# Patient Record
Sex: Male | Born: 1965 | Race: White | Hispanic: No | Marital: Single | Smoking: Current every day smoker
Health system: Southern US, Community
[De-identification: ages and names within clinical notes are randomized; demographics above are authoritative.]

## PROBLEM LIST (undated history)

## (undated) DIAGNOSIS — F419 Anxiety disorder, unspecified: Secondary | ICD-10-CM

## (undated) DIAGNOSIS — I10 Essential (primary) hypertension: Secondary | ICD-10-CM

## (undated) HISTORY — PX: APPENDECTOMY: SHX54

---

## 2016-06-13 ENCOUNTER — Emergency Department (HOSPITAL_BASED_OUTPATIENT_CLINIC_OR_DEPARTMENT_OTHER)
Admission: EM | Admit: 2016-06-13 | Discharge: 2016-06-13 | Disposition: A | Discharge status: Home / Self Care | Payer: Self-pay | Attending: Emergency Medicine | Admitting: Emergency Medicine

## 2016-06-13 ENCOUNTER — Emergency Department (HOSPITAL_BASED_OUTPATIENT_CLINIC_OR_DEPARTMENT_OTHER): Payer: Self-pay

## 2016-06-13 ENCOUNTER — Encounter (HOSPITAL_BASED_OUTPATIENT_CLINIC_OR_DEPARTMENT_OTHER): Payer: Self-pay | Admitting: Emergency Medicine

## 2016-06-13 DIAGNOSIS — I10 Essential (primary) hypertension: Secondary | ICD-10-CM | POA: Insufficient documentation

## 2016-06-13 DIAGNOSIS — M25552 Pain in left hip: Secondary | ICD-10-CM | POA: Insufficient documentation

## 2016-06-13 DIAGNOSIS — F17200 Nicotine dependence, unspecified, uncomplicated: Secondary | ICD-10-CM | POA: Insufficient documentation

## 2016-06-13 DIAGNOSIS — R030 Elevated blood-pressure reading, without diagnosis of hypertension: Secondary | ICD-10-CM

## 2016-06-13 HISTORY — DX: Essential (primary) hypertension: I10

## 2016-06-13 HISTORY — DX: Anxiety disorder, unspecified: F41.9

## 2016-06-13 MED ORDER — KETOROLAC TROMETHAMINE 30 MG/ML IJ SOLN
30.0000 mg | Freq: Once | INTRAMUSCULAR | Status: AC
  Administered 2016-06-13: 30 mg via INTRAMUSCULAR
  Filled 2016-06-13: qty 1

## 2016-06-13 MED ORDER — METHYLPREDNISOLONE 4 MG PO TBPK: ORAL_TABLET | ORAL | 0 refills | Status: AC

## 2016-06-13 MED ORDER — METHOCARBAMOL 500 MG PO TABS
500.0000 mg | ORAL_TABLET | Freq: Once | ORAL | Status: AC
  Administered 2016-06-13: 500 mg via ORAL
  Filled 2016-06-13: qty 1

## 2016-06-13 MED ORDER — METHOCARBAMOL 500 MG PO TABS: 500.0000 mg | ORAL_TABLET | Freq: Two times a day (BID) | ORAL | 0 refills | Status: AC | PRN

## 2016-06-13 NOTE — ED Triage Notes (Signed)
Pt reports L hip pain x 1 week. Denies injury. Works as a [employer]at[employer]ic.

## 2016-06-13 NOTE — Discharge Instructions (Signed)
Take steroid dosepack as directed. Robaxin as needed for muscle spasms/tightness. You may also take ibuprofen or tylenol as needed for pain. Ice affected area for additional pain relief. If no improvement in symptoms, please call the sports medicine physician listed for re-evaluation of your symptoms.   Your blood pressure was elevated today. Continue taking your bloody pressure medication daily as directed. Please follow up with your primary care provider for a BP recheck in the next week.   Return to ER for new or worsening symptoms, any additional concerns.

## 2016-06-13 NOTE — ED Provider Notes (Signed)
[facility]   CSN: [id number] Arrival date & time: 06/13/16  1332     History   Chief Complaint Chief Complaint  Patient presents with  . Hip Pain    HPI Melvin Mendez is a 51 y.o. male.  The history is provided by the patient and medical records. No language interpreter was used.  Hip Pain    Melvin Mendez is a 51 y.o. male  with a PMH of HTN, anxiety who presents to the Emergency Department complaining of persistent aching, non-radiating left hip pain x 7-10 days. Pain is worse with movement and better when still. He is taken ibuprofen, Goody's powder and Tylenol with little relief in symptoms. Pain is worst thing in the morning and seems to improve as the day goes on. No history of similar. 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 No numbness or tingling. No low back pain. No fevers, bowel or bladder incontinence or saddle anesthesia. No rash or overlying skin changes.   Past Medical History:  Diagnosis Date  . Anxiety   . Hypertension     There are no active problems to display for this patient.   Past Surgical History:  Procedure Laterality Date  . APPENDECTOMY         Home Medications    Prior to Admission medications   Medication Sig Start Date End Date Taking? Authorizing Provider  methocarbamol (ROBAXIN) 500 MG tablet Take 1 tablet (500 mg total) by mouth 2 (two) times daily as needed for muscle spasms. 06/13/16   Jaime Pilcher Ward, PA-C  methylPREDNISolone (MEDROL DOSEPAK) 4 MG TBPK tablet Take as directed. 06/13/16   Chase PicketJaime Pilcher Ward, PA-C    Family History No family history on file.  Social History Social History  Substance Use Topics  . Smoking status: Current Every Day Smoker  . Smokeless tobacco: Never Used  . Alcohol use No     Allergies   Patient has no known allergies.   Review of Systems Review of Systems  Musculoskeletal: Positive for arthralgias. Negative for back pain.  Skin:  Negative for color change and rash.  Neurological: Negative for weakness and numbness.     Physical Exam Updated Vital Signs BP (!) 142/101 (BP Location: Right Arm)   Pulse 67   Temp 98.2 F (36.8 C) (Oral)   Resp 18   Ht 5' 5.5" (1.664 m)   Wt 63.5 kg   SpO2 100%   BMI 22.94 kg/m   Physical Exam  Constitutional: He is oriented to person, place, and time. He appears well-developed and well-nourished. No distress.  HENT:  Head: Normocephalic and atraumatic.  Cardiovascular: Normal rate, regular rhythm and normal heart sounds.   No murmur heard. Pulmonary/Chest: Effort normal and breath sounds normal. No respiratory distress.  Abdominal: Soft. He exhibits no distension. There is no tenderness.  Musculoskeletal:  Tenderness to palpation of the left lateral hip and buttocks. No overlying skin changes. Full range of motion, although with pain. Straight leg raises negative bilaterally for radicular symptoms. No midline C/T/L-spine tenderness. 2+ DP and sensation intact bilaterally.   Neurological: He is alert and oriented to person, place, and time.  Skin: Skin is warm and dry.  Nursing note and vitals reviewed.    ED Treatments / Results  Labs (all labs ordered are listed, but only abnormal results are displayed) Labs Reviewed - No data to display  EKG  EKG Interpretation None       Radiology Dg  Hip Unilat W Or Wo Pelvis 2-3 Views Left  Result Date: 06/13/2016 CLINICAL DATA:  Left hip pain for the past 7-10 days. No known injury. EXAM: DG HIP (WITH OR WITHOUT PELVIS) 2-3V LEFT COMPARISON:  None. FINDINGS: No fracture or dislocation. Mild degenerative change the left hip with joint space loss, subchondral sclerosis and osteophytosis. Limited visualization the pelvis is normal. Suspected mild degenerative change of the contralateral right hip, incompletely evaluated. Regional soft tissues appear normal. IMPRESSION: 1. No acute findings. 2. 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 Electronically Signed   By: Simonne Come M.D.   On: 06/13/2016 14:07    Procedures Procedures (including critical care time)  Medications Ordered in ED Medications  ketorolac (TORADOL) 30 MG/ML injection 30 mg (30 mg Intramuscular Given 06/13/16 1530)  methocarbamol (ROBAXIN) tablet 500 mg (500 mg Oral Given 06/13/16 1530)     Initial Impression / Assessment and Plan / ED Course  I have reviewed the triage vital signs and the nursing notes.  Pertinent labs & imaging results that were available during my care of the patient were reviewed by me and considered in my medical decision making (see chart for details).    Rush Salce is a 51 y.o. male who presents to ED for left hip pain. No back pain nor red flag symptoms of back pain. No overlying skin changes. Bilateral LE's NVI. X-ray negative for acute findings. Will treat with muscle relaxer and medrol dose pack. Sports medicine follow up if no improvement. Symptomatic home care instructions discussed with patient.   BP elevated in ED today. Patient with history of hypertension and states he's been compliant with medications. Encouraged continuing to take medication as directed and follow up with PCP this week for BP recheck.  Reasons to return to ER were discussed. The patient understands plan as dictated above and all questions were answered.   Final Clinical Impressions(s) / ED Diagnoses   Final diagnoses:  Left hip pain  Elevated blood pressure reading    New Prescriptions New Prescriptions   METHOCARBAMOL (ROBAXIN) 500 MG TABLET    Take 1 tablet (500 mg total) by mouth 2 (two) times daily as needed for muscle spasms.   METHYLPREDNISOLONE (MEDROL DOSEPAK) 4 MG TBPK TABLET    Take as directed.     [facility] Ward, PA-C 06/13/16 1611    Vanetta Mulders, MD 06/15/16 [phone]

## 2016-06-16 ENCOUNTER — Encounter (HOSPITAL_BASED_OUTPATIENT_CLINIC_OR_DEPARTMENT_OTHER): Payer: Self-pay

## 2016-06-16 ENCOUNTER — Emergency Department (HOSPITAL_BASED_OUTPATIENT_CLINIC_OR_DEPARTMENT_OTHER)
Admission: EM | Admit: 2016-06-16 | Discharge: 2016-06-16 | Disposition: A | Discharge status: Home / Self Care | Payer: Self-pay | Attending: Emergency Medicine | Admitting: Emergency Medicine

## 2016-06-16 DIAGNOSIS — M25552 Pain in left hip: Secondary | ICD-10-CM | POA: Insufficient documentation

## 2016-06-16 DIAGNOSIS — Z79899 Other long term (current) drug therapy: Secondary | ICD-10-CM | POA: Insufficient documentation

## 2016-06-16 DIAGNOSIS — F17210 Nicotine dependence, cigarettes, uncomplicated: Secondary | ICD-10-CM | POA: Insufficient documentation

## 2016-06-16 DIAGNOSIS — I10 Essential (primary) hypertension: Secondary | ICD-10-CM | POA: Insufficient documentation

## 2016-06-16 MED ORDER — KETOROLAC TROMETHAMINE 60 MG/2ML IM SOLN
60.0000 mg | Freq: Once | INTRAMUSCULAR | Status: AC
  Administered 2016-06-16: 60 mg via INTRAMUSCULAR
  Filled 2016-06-16: qty 2

## 2016-06-16 MED ORDER — TRAMADOL HCL 50 MG PO TABS
50.0000 mg | ORAL_TABLET | Freq: Once | ORAL | Status: AC
Start: 2016-06-16 — End: 2016-06-16
  Administered 2016-06-16: 50 mg via ORAL
  Filled 2016-06-16: qty 1

## 2016-06-16 MED ORDER — TRAMADOL HCL 50 MG PO TABS: 50.0000 mg | ORAL_TABLET | Freq: Four times a day (QID) | ORAL | 0 refills | Status: AC | PRN

## 2016-06-16 NOTE — ED Provider Notes (Signed)
[facility]   CSN: [id number] Arrival date & time: 06/16/16  1558  By signing my name below, I, Melvin Mendez, attest that this documentation has been prepared under the direction and in the presence of [employer], PA-C. Electronically Signed: Linna Mendez, Scribe. 06/16/2016. 4:48 PM.  History   Chief Complaint Chief Complaint  Patient presents with  . Hip Pain    The history is provided by the patient. No language interpreter was used.    HPI Comments: Melvin Mendez is a 51 y.o. male who presents to the Emergency Department complaining of constant, gradually worsening, sharp. non-radiating left hip pain for just over one week. He states his pain is exacerbated by ambulation and manipulation of his left hip and is improved at rest. Pt was seen here on 3/17 for the same and had a left hip x-ray taken which showed arthritis and degenerative changes of his left hip; he was discharged with Robaxin and a medrol dose pack and has taken them with little improvement of his pain. Pt has also tried multiple OTC medications with minimal improvement of his pain. 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 He denies numbness/tingling, lower back pain, fevers, or any other associated symptoms. He intends to follow up with sports medicine as soon as possible but does not currently have a scheduled appointment.   Past Medical History:  Diagnosis Date  . Anxiety   . Hypertension     There are no active problems to display for this patient.   Past Surgical History:  Procedure Laterality Date  . APPENDECTOMY         Home Medications    Prior to Admission medications   Medication Sig Start Date End Date Taking? Authorizing Provider  methocarbamol (ROBAXIN) 500 MG tablet Take 1 tablet (500 mg total) by mouth 2 (two) times daily as needed for muscle spasms. 06/13/16   Jaime Pilcher  Ward, PA-C  methylPREDNISolone (MEDROL DOSEPAK) 4 MG TBPK tablet Take as directed. 06/13/16   Chase Picket Ward, PA-C    Family History No family history on file.  Social History Social History  Substance Use Topics  . Smoking status: Current Every Day Smoker    Types: Cigarettes  . Smokeless tobacco: Never Used  . Alcohol use No     Allergies   Patient has no known allergies.   Review of Systems Review of Systems  Constitutional: Negative for activity change, fever and unexpected weight change.  Gastrointestinal: Negative for constipation.       Neg for fecal incontinence  Genitourinary: Negative for difficulty urinating, flank pain and hematuria.       Negative for urinary incontinence or retention  Musculoskeletal: Positive for arthralgias, gait problem (2/2 pain) and myalgias. Negative for back pain, joint swelling and neck pain.  Skin: Negative for wound.  Neurological: Negative for weakness and numbness.       Negative for saddle paresthesias     Physical Exam Updated Vital Signs BP (!) 171/111 (BP Location: Left Arm)   Pulse (!) 103   Temp 98.4 F (36.9 C) (Oral)   Resp 20   Ht 5\' 6"  (1.676 m)   Wt 140 lb (63.5 kg)   SpO2 100%   BMI 22.60 kg/m   Physical Exam  Constitutional: He is oriented to person, place, and time. He appears well-developed and well-nourished. No distress.  HENT:  Head:  Normocephalic and atraumatic.  Eyes: Conjunctivae and EOM are normal.  Neck: Normal range of motion. Neck supple. No tracheal deviation present.  Cardiovascular: Normal rate and normal pulses.  Exam reveals no decreased pulses.   Pulmonary/Chest: Effort normal. No respiratory distress.  Musculoskeletal: Normal range of motion. He exhibits tenderness. He exhibits no edema.       Left hip: He exhibits tenderness. He exhibits normal range of motion and normal strength.       Left knee: Normal.       Lumbar back: Normal.       Left upper leg: He exhibits tenderness. He  exhibits no bony tenderness, no swelling and no edema.       Legs: Neurological: He is alert and oriented to person, place, and time. No sensory deficit.  Motor, sensation, and vascular distal to the injury is fully intact.   Skin: Skin is warm and dry.  Psychiatric: He has a normal mood and affect. His behavior is normal.  Nursing note and vitals reviewed.   ED Treatments / Results   Procedures Procedures (including critical care time)  DIAGNOSTIC STUDIES: Oxygen Saturation is 100% on RA, normal by my interpretation.    COORDINATION OF CARE: 4:57 PM Patient seen and examined. Reviewed imaging from previous visit. Discussed treatment plan with pt at bedside and pt agreed to plan.  Medications Ordered in ED Medications - No data to display   Initial Impression / Assessment and Plan / ED Course  I have reviewed the triage vital signs and the nursing notes.  Pertinent labs & imaging results that were available during my care of the patient were reviewed by me and considered in my medical decision making (see chart for details).     Will discharged on tramadol. Patient given oral tramadol and IM Toradol here. Strongly encouraged follow-up with sports medicine. Patient states he will call for an appointment tomorrow morning.  Counseled on rice protocol. Crutches given to help with ambulation. Patient can use these as needed.  Patient counseled on use of Tramadol. Counseled not to combine these medications with others containing tylenol. Urged not to drink alcohol, drive, or perform any other activities that requires focus while taking these medications. The patient verbalizes understanding and agrees with the plan.   Final Clinical Impressions(s) / ED Diagnoses   Final diagnoses:  Left hip pain   Patient with left hip pain and left posterior thigh pain without swelling or injury. Pain gradually worsened over the past 1 week. Pain is better at rest and worse with activity and  bearing weight. Pain is localized but has neurologic features. No back pain or red flags. No signs of soft tissue swelling, cellulitis, signs of DVT. Strongly encouraged sports medicine follow-up for more definitive diagnosis and management.  New Prescriptions New Prescriptions   TRAMADOL (ULTRAM) 50 MG TABLET    Take 1 tablet (50 mg total) by mouth every 6 (six) hours as needed.   I personally performed the services described in this documentation, which was scribed in my presence. The recorded information has been reviewed and is accurate.    Renne CriglerJoshua Josslyn Ciolek, PA-C 06/16/16 1747    Lavera Guiseana Duo Liu, MD 06/17/16 [phone]

## 2016-06-16 NOTE — ED Triage Notes (Signed)
c/o left hip pain x 7-10 days-denies injury-seen here for same 3/17-did not follow up with ortho

## 2016-06-16 NOTE — Discharge Instructions (Signed)
Please read and follow all provided instructions.  Your diagnoses today include:  1. Left hip pain     Tests performed today include:  Vital signs. See below for your results today.   Medications prescribed:   Tramadol - narcotic-like pain medication  DO NOT drive or perform any activities that require you to be awake and alert because this medicine can make you drowsy.   Take any prescribed medications only as directed.  Home care instructions:   Follow any educational materials contained in this packet  Follow R.I.C.E. Protocol:  R - rest your injury   I  - use ice on injury without applying directly to skin  C - compress injury with bandage or splint  E - elevate the injury as much as possible  Follow-up instructions: Please follow-up with the provided orthopedic physician (bone specialist) as soon as possible.   Return instructions:   Please return if your toes or feet are numb or tingling, appear gray or blue, or you have severe pain (also elevate the leg and loosen splint or wrap if you were given one)  Please return to the Emergency Department if you experience worsening symptoms.   Please return if you have any other emergent concerns.  Additional Information:  Your vital signs today were: BP (!) 171/111 (BP Location: Left Arm)    Pulse (!) 103    Temp 98.4 F (36.9 C) (Oral)    Resp 20    Ht 5\' 6"  (1.676 m)    Wt 63.5 kg    SpO2 100%    BMI 22.60 kg/m  If your blood pressure (BP) was elevated above 135/85 this visit, please have this repeated by your doctor within one month. -------------- If prescribed crutches for your injury: use crutches with non-weight bearing for the first few days. Then, you may walk as the pain allows, or as instructed. Start gradually with weight bearing on the affected side. Once you can walk pain free, then try jogging. When you can run forwards, then you can try moving side-to-side. If you cannot walk without crutches in one  week, you need a re-check. --------------

## 2016-06-16 NOTE — ED Notes (Signed)
ED Provider at bedside. 

## 2018-04-12 IMAGING — DX DG HIP (WITH OR WITHOUT PELVIS) 2-3V*L*
3 series · 3 of 3 positions shown · non-contrast
Comparison: None.

CLINICAL DATA: Left hip pain for the past 7-10 days. No known
injury.

EXAM:
DG HIP (WITH OR WITHOUT PELVIS) 2-3V LEFT

[pelvis ap]
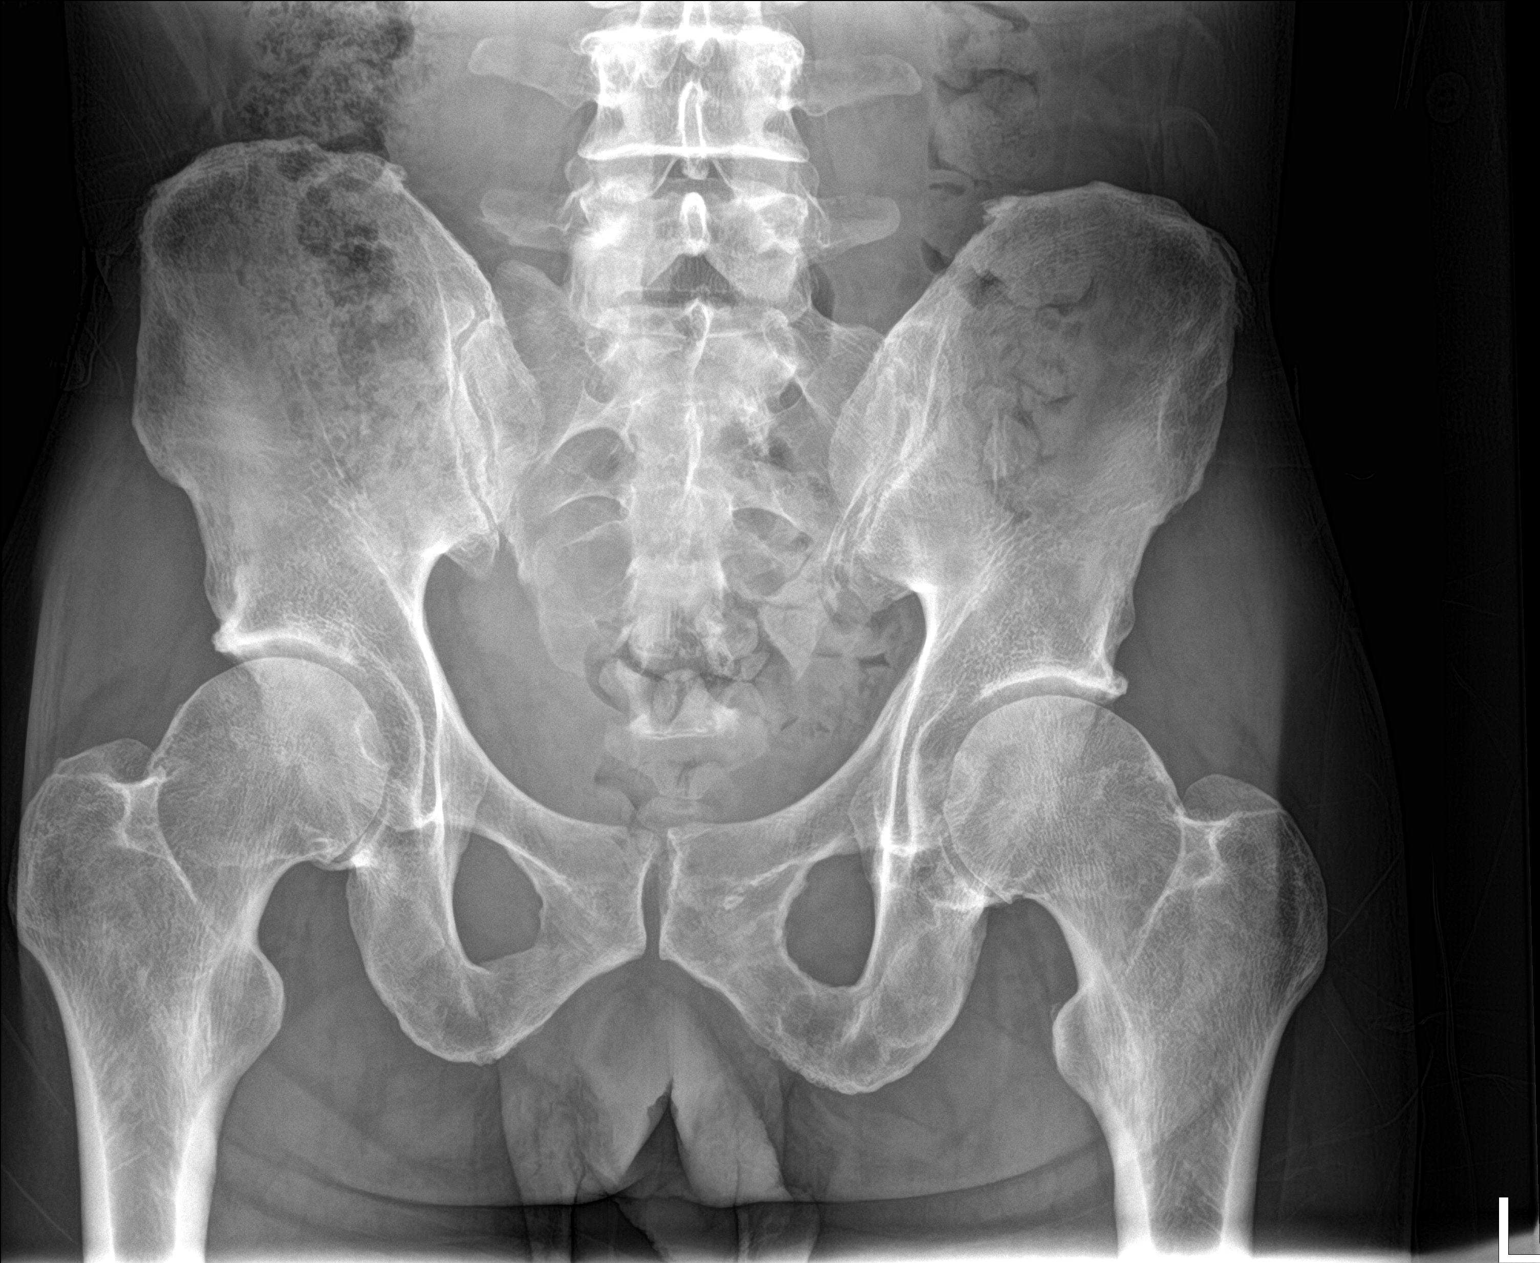

[hip ap]
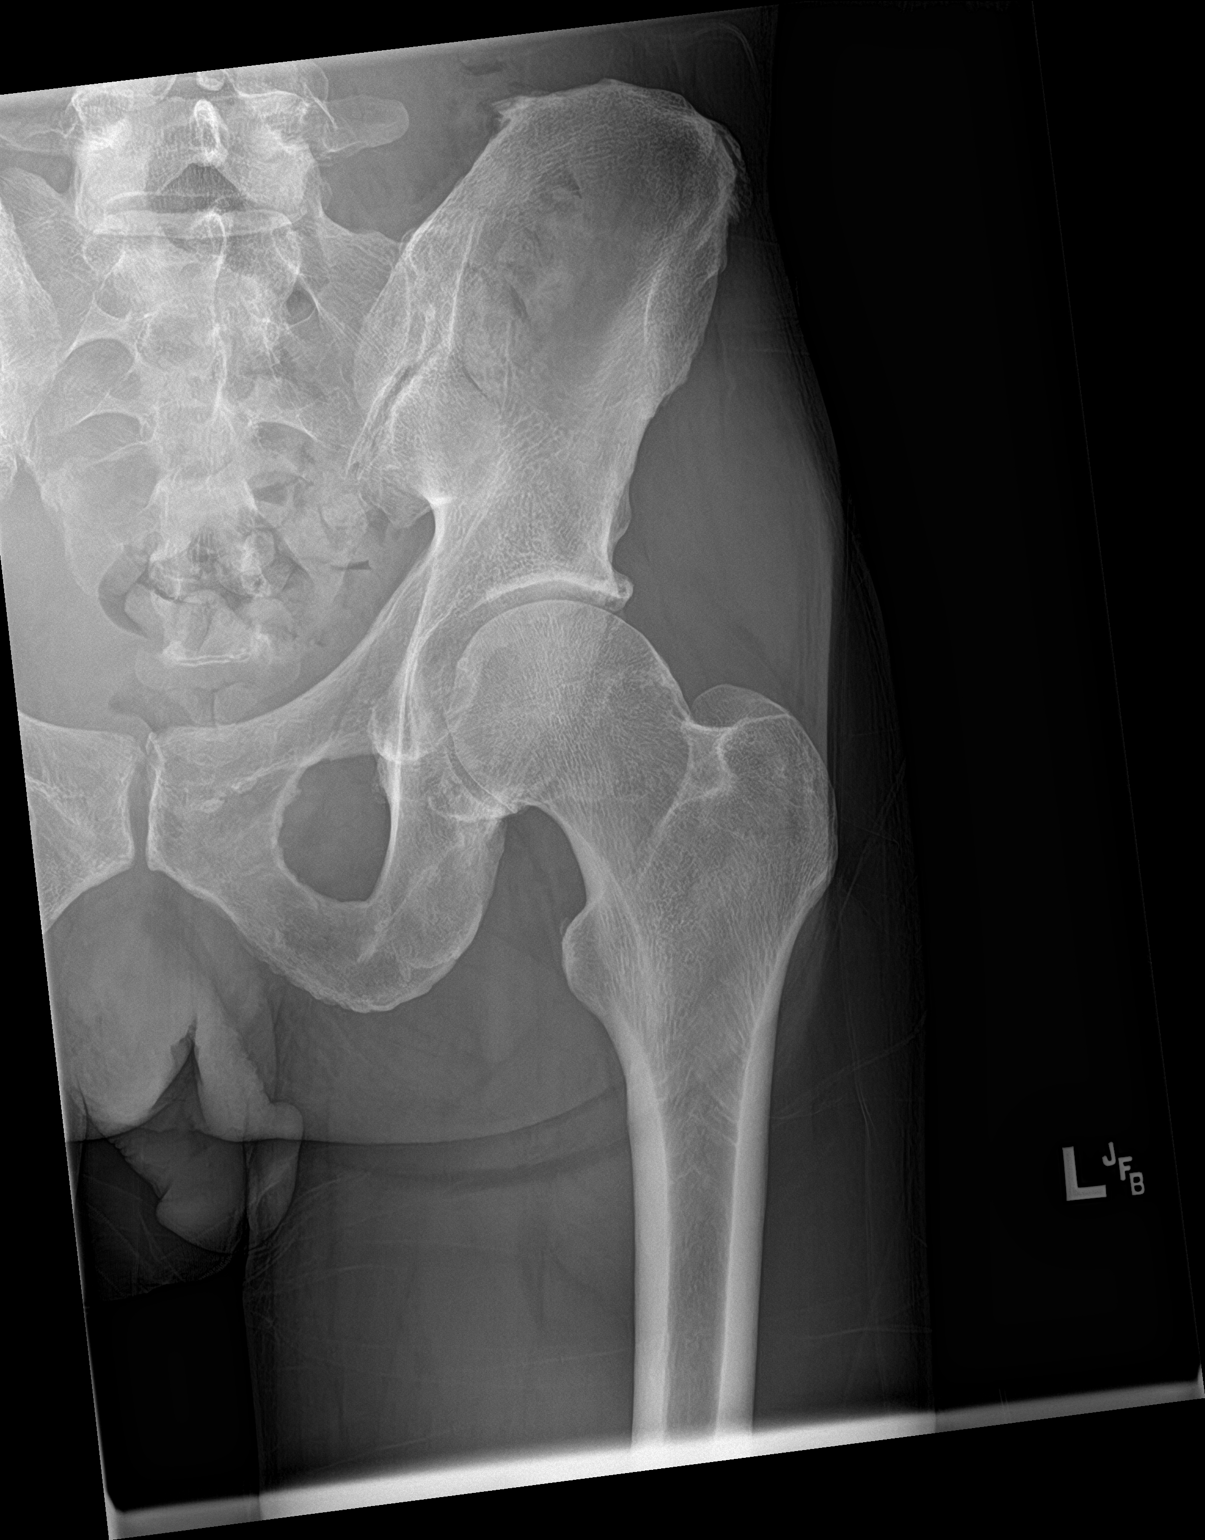

[hip lat]
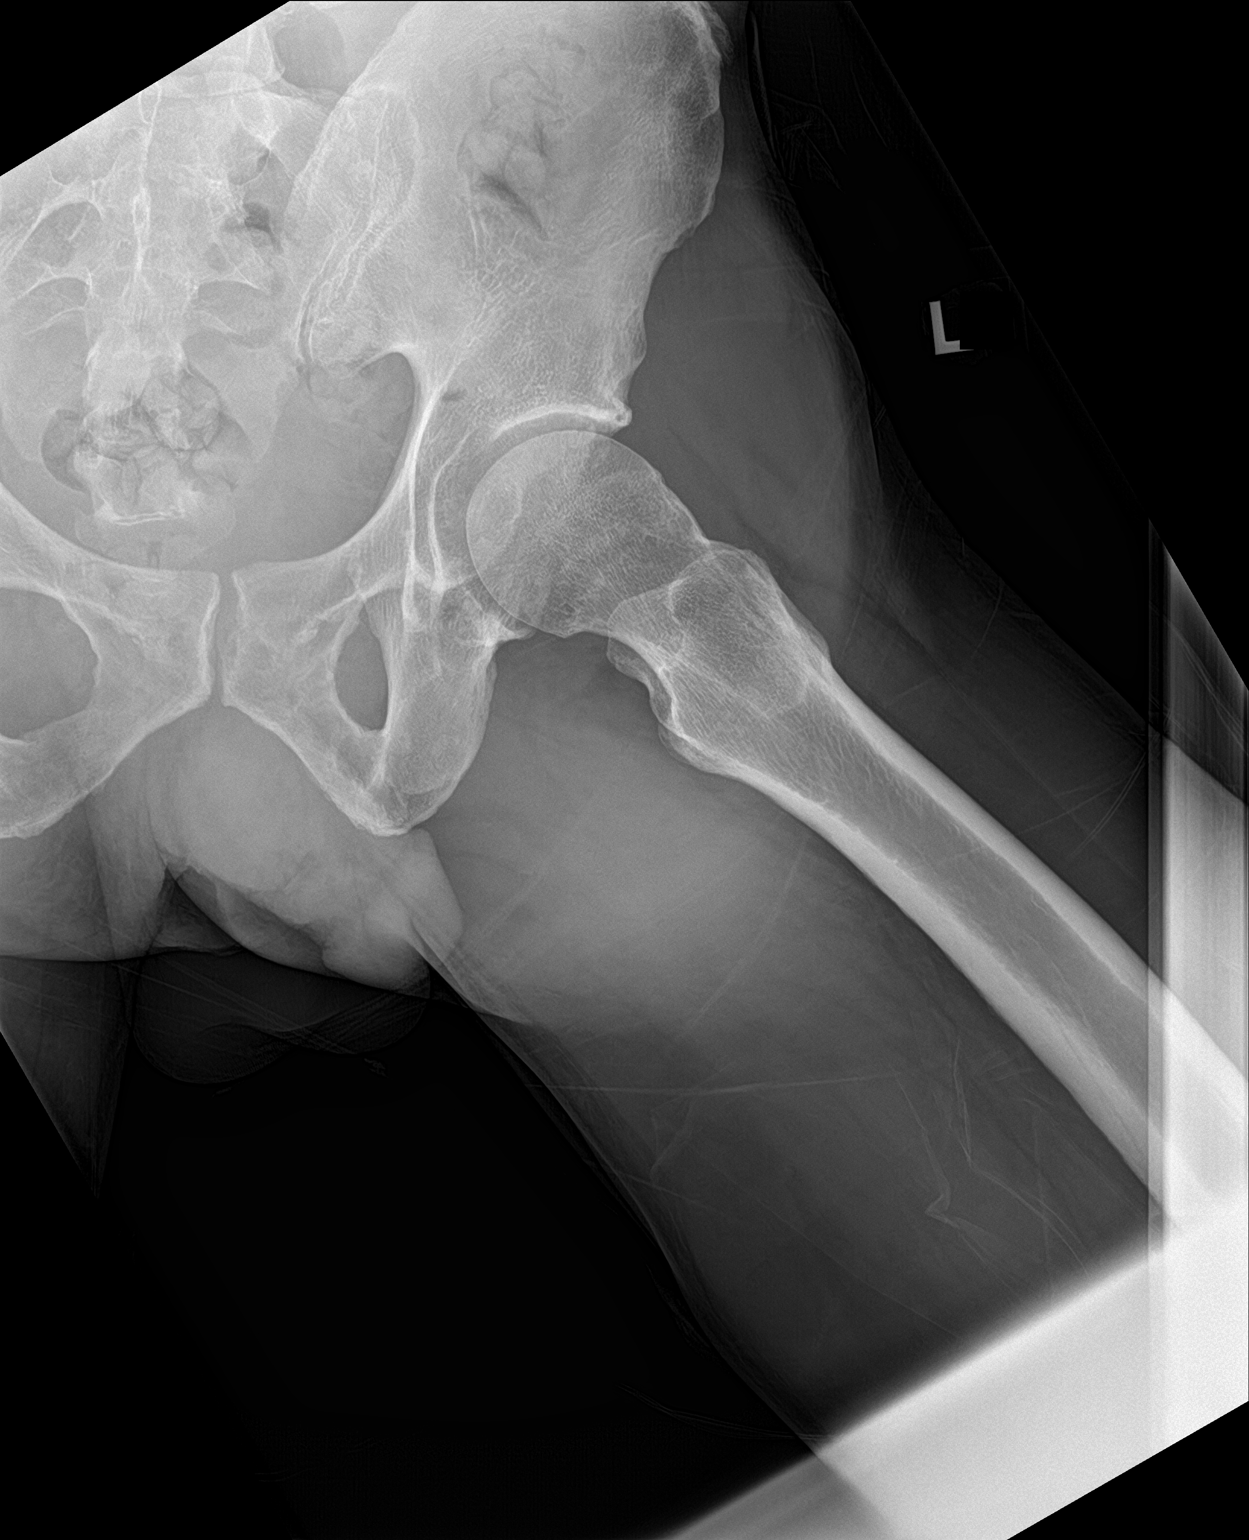

[3 of 3 positions shown; findings below may reference images not displayed]

FINDINGS: No fracture or dislocation. Mild degenerative change the left hip
with joint space loss, subchondral sclerosis and osteophytosis.

Limited visualization the pelvis is normal. Suspected mild
degenerative change of the contralateral right hip, incompletely
evaluated. Regional soft tissues appear normal.
IMPRESSION: 1. No acute findings.
2. Mild degenerative change of the left hip.
